# Patient Record
Sex: Female | Born: 1975 | Race: Black or African American | Hispanic: No | Marital: Married | Smoking: Current every day smoker
Health system: Southern US, Community
[De-identification: ages and names within clinical notes are randomized; demographics above are authoritative.]

---

## 2004-11-08 ENCOUNTER — Emergency Department: Payer: Self-pay | Admitting: Emergency Medicine

## 2005-07-18 ENCOUNTER — Emergency Department: Payer: Self-pay | Admitting: Emergency Medicine

## 2007-10-06 ENCOUNTER — Emergency Department: Payer: Self-pay | Admitting: Emergency Medicine

## 2007-12-04 ENCOUNTER — Ambulatory Visit: Payer: Self-pay | Admitting: Family Medicine

## 2007-12-30 ENCOUNTER — Ambulatory Visit: Payer: Self-pay | Admitting: Obstetrics and Gynecology

## 2008-01-02 ENCOUNTER — Ambulatory Visit: Payer: Self-pay | Admitting: Obstetrics and Gynecology

## 2008-01-09 ENCOUNTER — Ambulatory Visit: Payer: Self-pay | Admitting: Obstetrics and Gynecology

## 2009-08-16 IMAGING — US US PELV - US TRANSVAGINAL
1 series · 17 of 25 positions shown · non-contrast
Comparison: none

REASON FOR EXAM: s/p tubal ligation x 3yrs  positive pregnancy test
currently bleeding  CALL ...
COMMENTS:

[Series 1: us pelv - us transvaginal · 17 of 61 slices shown]
[im 1/61]
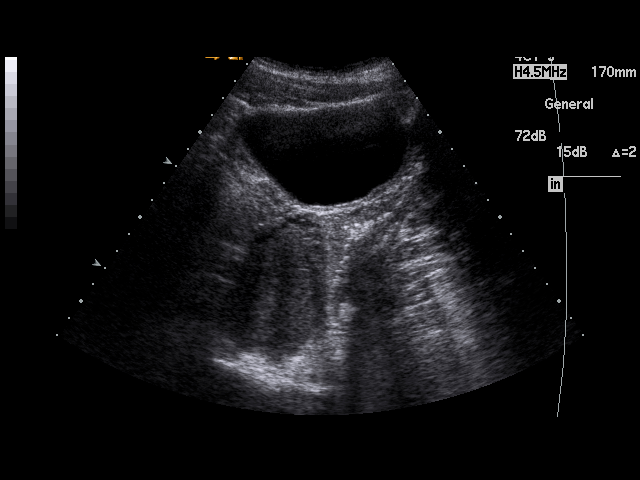
[im 6/61]
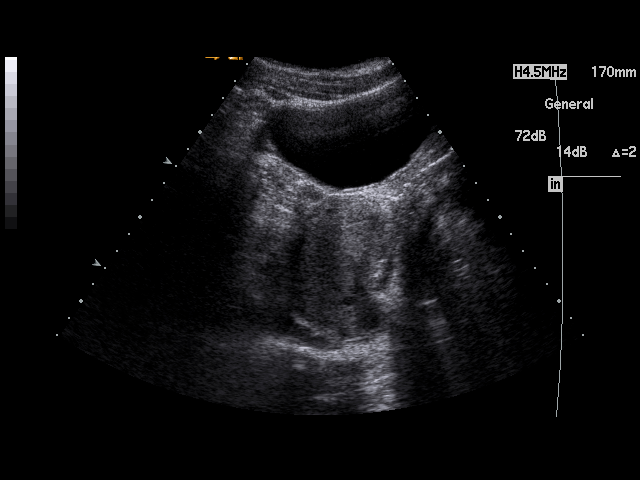
[im 8/61]
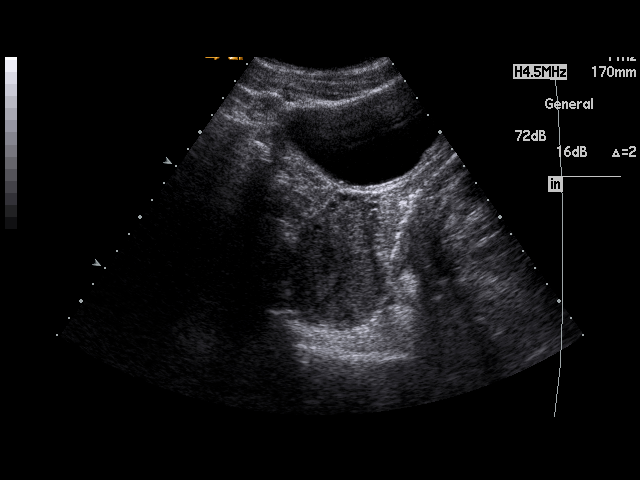
[im 13/61]
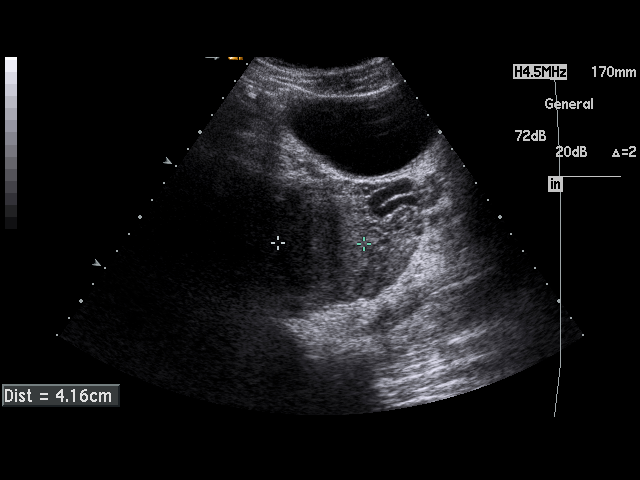
[im 16/61]
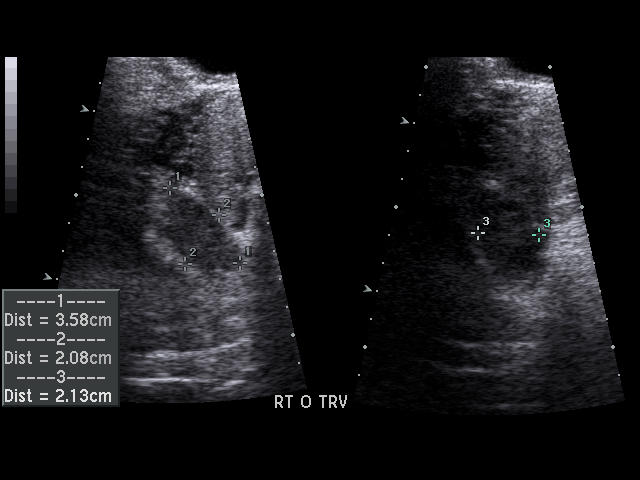
[im 21/61]
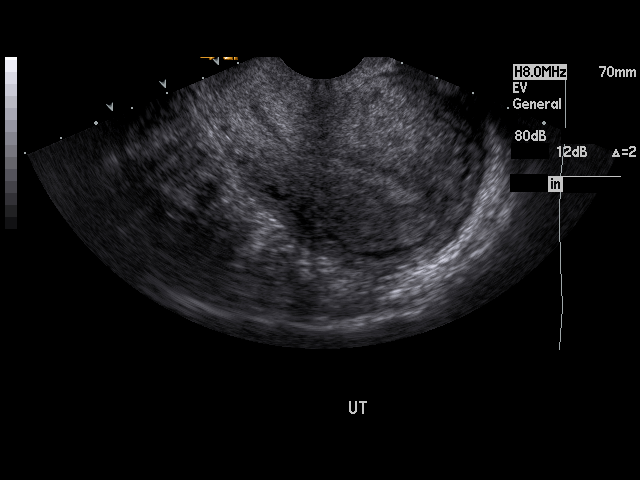
[im 23/61]
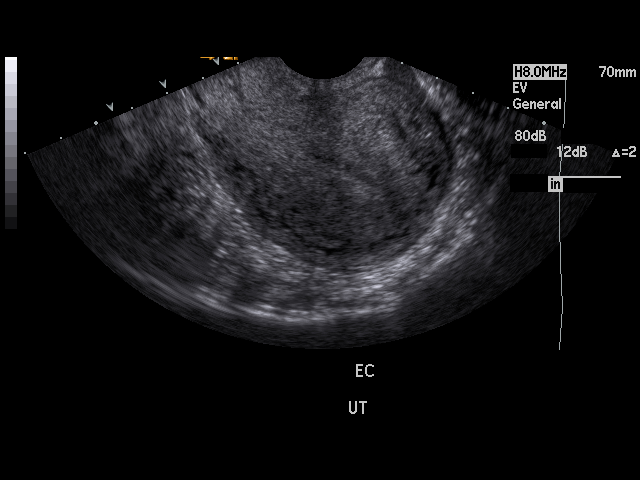
[im 28/61]
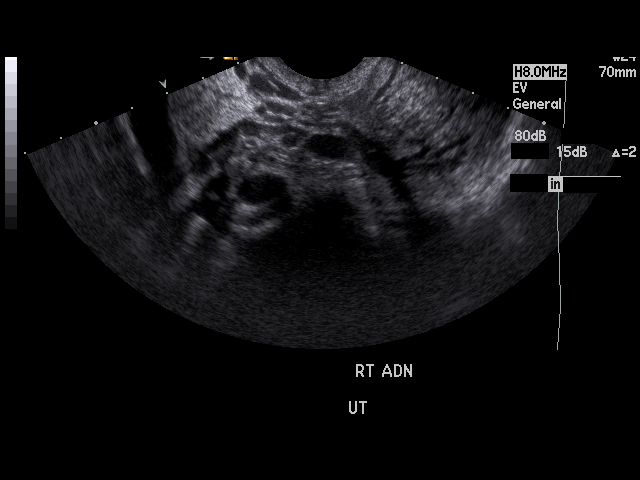
[im 31/61]
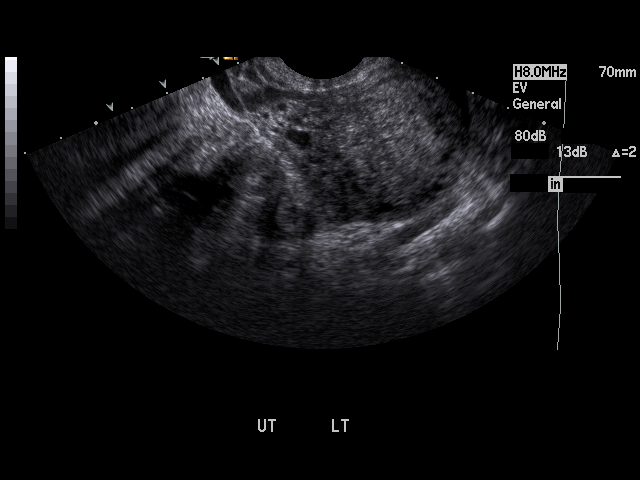
[im 33/61]
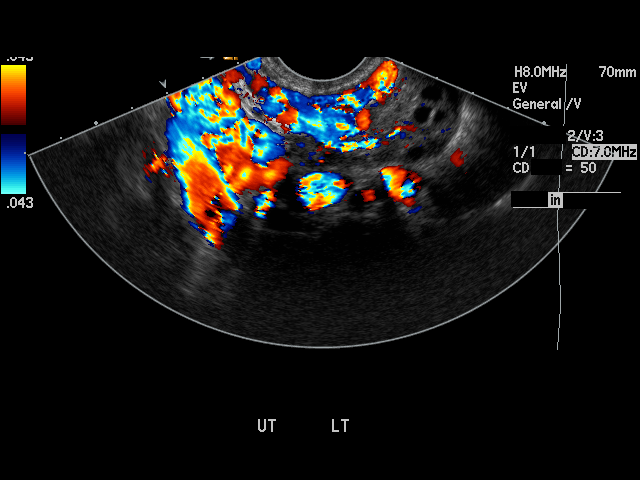
[im 38/61]
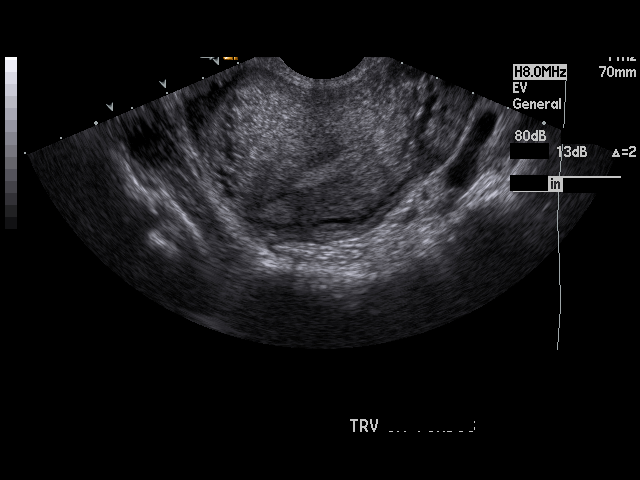
[im 41/61]
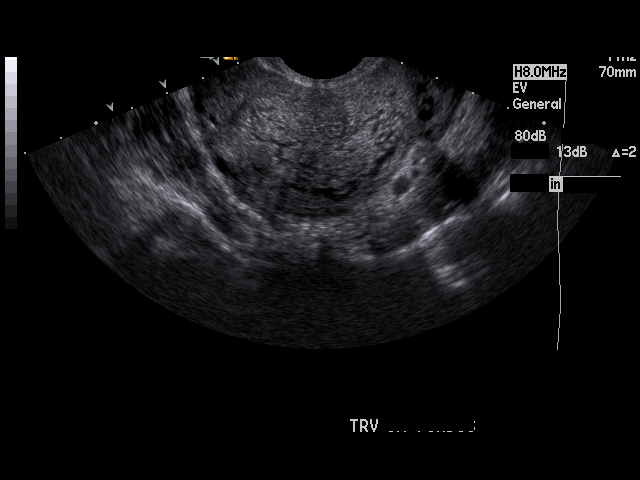
[im 46/61]
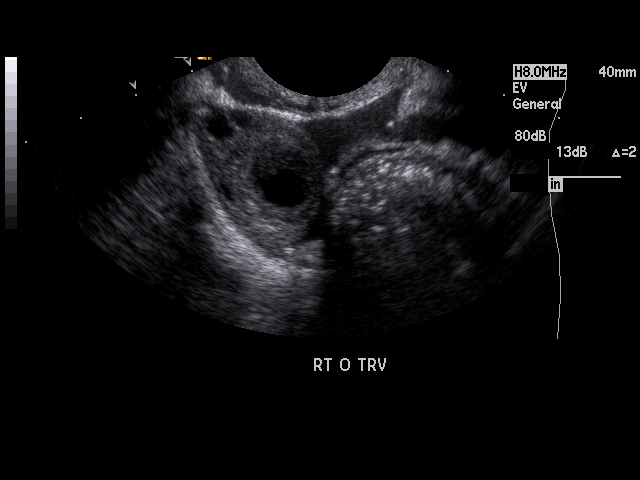
[im 48/61]
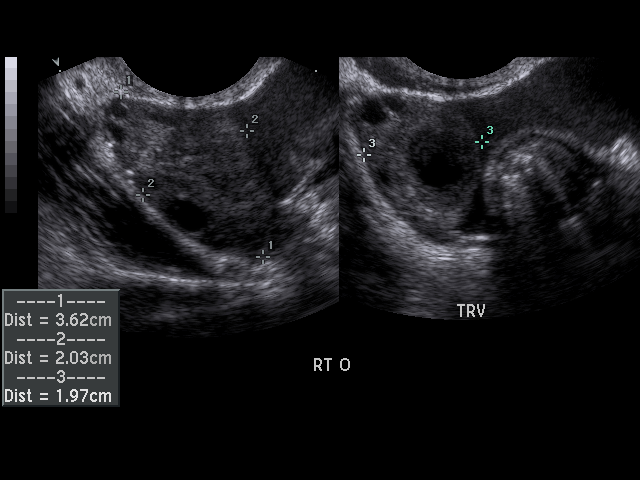
[im 53/61]
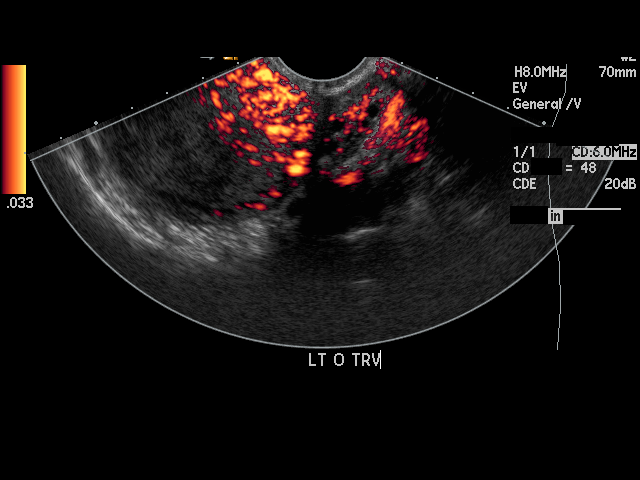
[im 56/61]
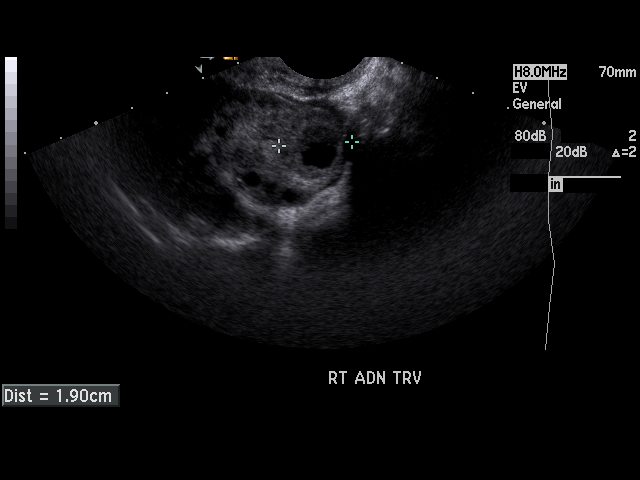
[im 61/61]
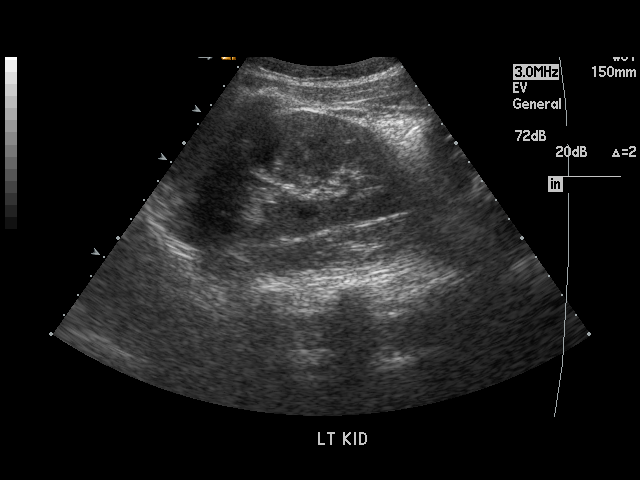

[17 of 25 positions shown; findings below may reference images not displayed]

PROCEDURE:     US  - US PELVIS MASS EXAM  - [DATE] [DATE] [DATE]  [DATE]

RESULT:     Transabdominal and endovaginal ultrasound was performed. The
uterus is retroverted. The uterus measures 8.97 cm x 4.08 cm x 4.6 cm.  No
uterine mass lesions are seen.  The endometrium measures 6.3 mm in
thickness. The RIGHT and LEFT ovaries are visualized. The RIGHT ovary
measures 3.62 cm at maximum diameter and the LEFT ovary measures 3.29 cm at
maximum diameter.  Follicular cysts are seen in each ovary. Additionally, in
the RIGHT ovary there is a hypoechoic area that measures approximately
cm and appears to represent a thick wall cyst. It is possible that this is a
cystic structure adjacent to the ovary rather than within the ovary. No echo
suspicious for fetal parts is seen within the structure. Nonetheless, in a
pregnant patient with no intrauterine gestation seen, the unlikely
possibility of this representing an ectopic pregnancy cannot be totally
excluded and therefore follow-up examination and or serial HCG determination
is recommended. There is a nonspecific trace of free fluid in the pelvis.
IMPRESSION: 1. No intrauterine gestation is observed. In the pregnant patient, the
differential includes very early intrauterine gestation that is not yet
visible sonographically, recent abortion, or ectopic pregnancy.
2. There is nonspecific 1.2 cm cystic area in or adjacent to the RIGHT ovary
for which follow-up examination is recommended.
3. There is a nonspecific trace of free fluid in the pelvis.

## 2009-09-11 IMAGING — US US BREAST BILAT
1 series · 17 of 25 positions shown · non-contrast
Comparison: none

REASON FOR EXAM: right breast area at 12 oclock  left 3x2 cm at 4 oclock
new found problem
COMMENTS:

[Series 1: us breast bilat · 17 of 30 slices shown]
[im 1/30]
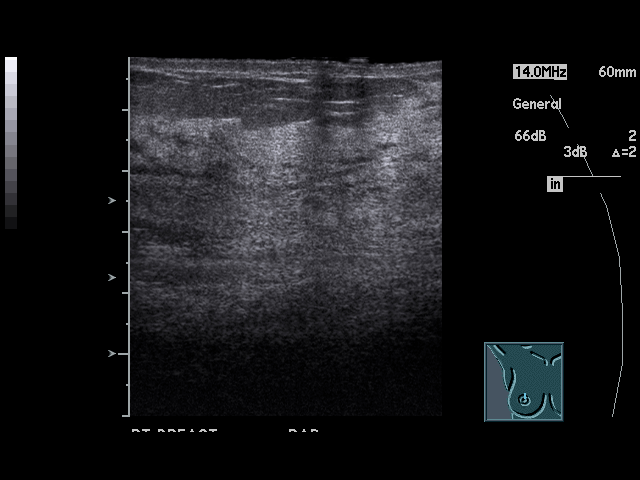
[im 3/30]
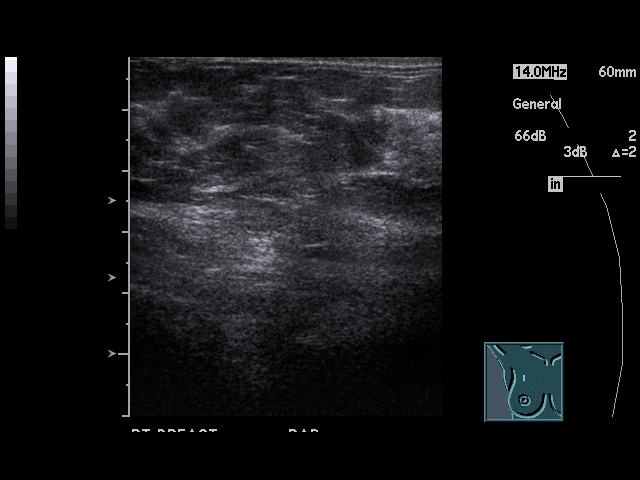
[im 4/30]
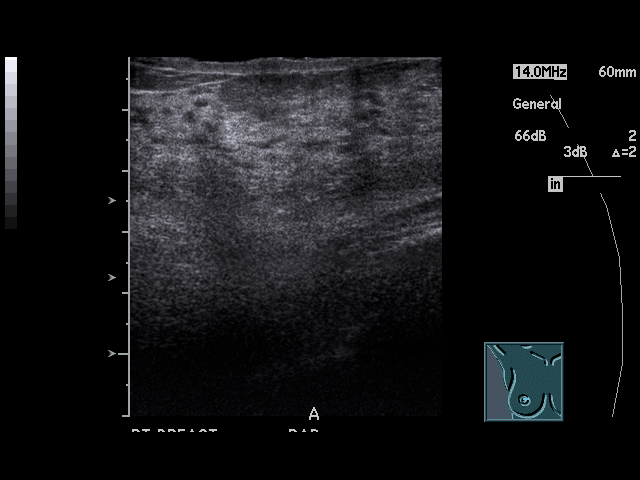
[im 7/30]
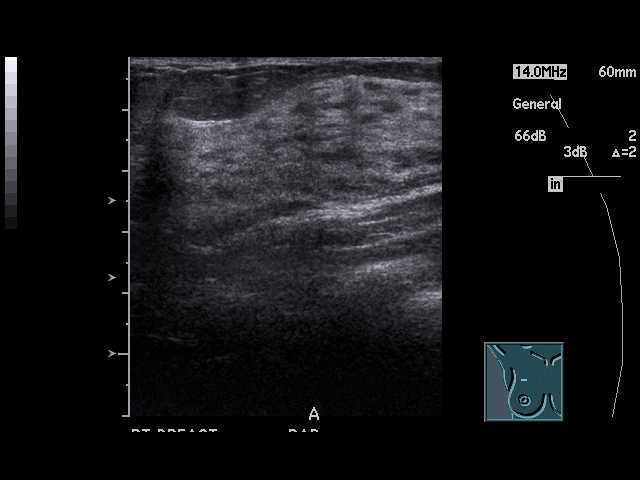
[im 8/30]
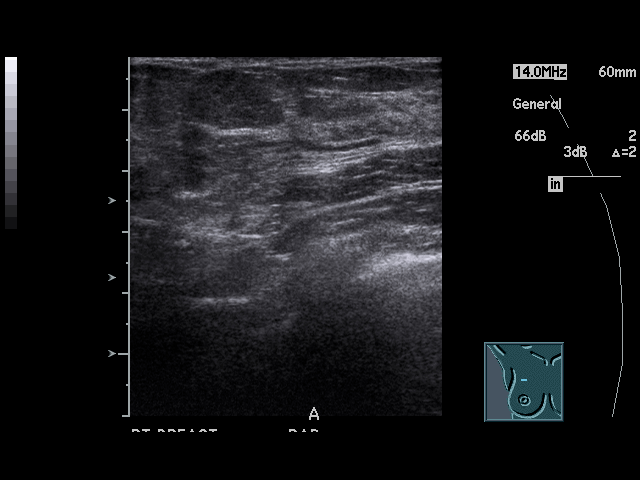
[im 10/30]
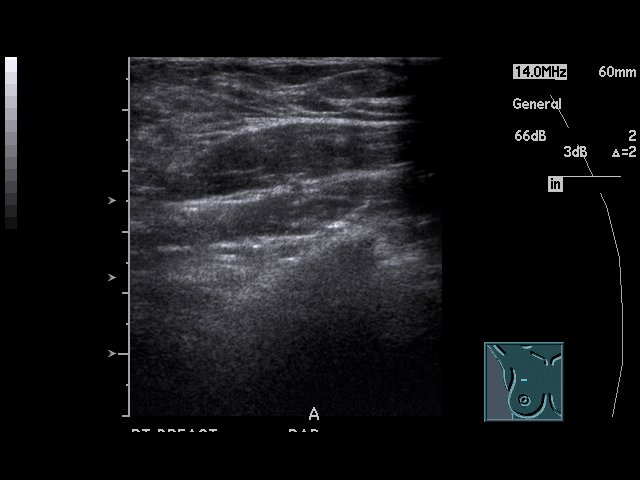
[im 11/30]
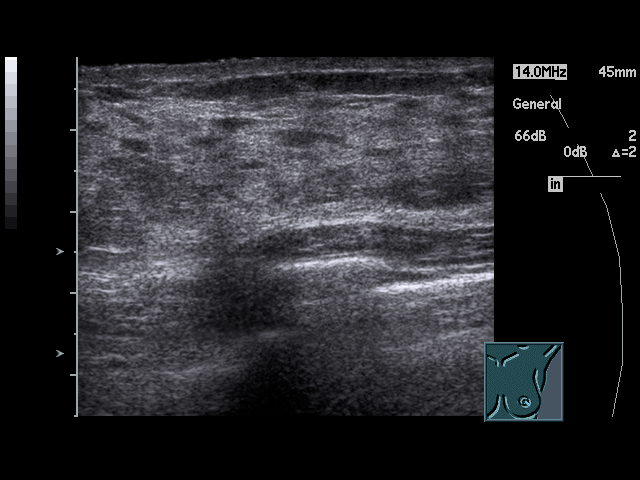
[im 14/30]
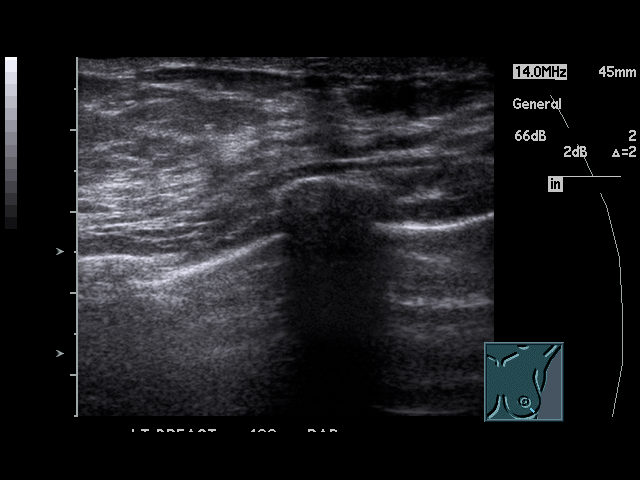
[im 15/30]
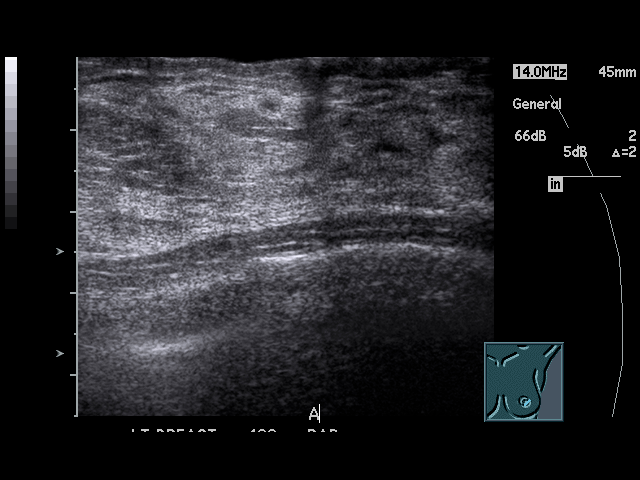
[im 16/30]
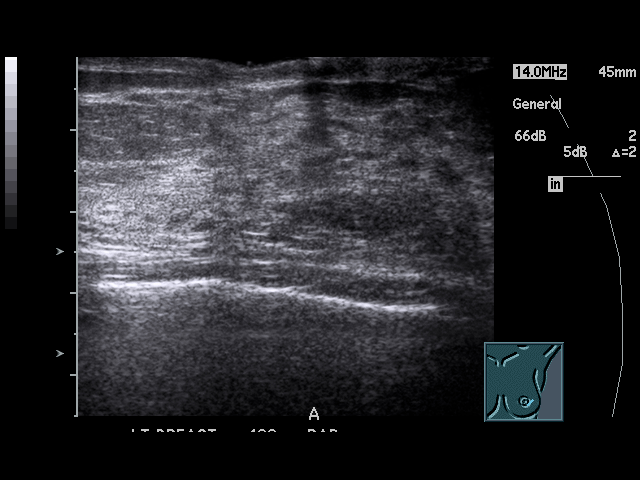
[im 19/30]
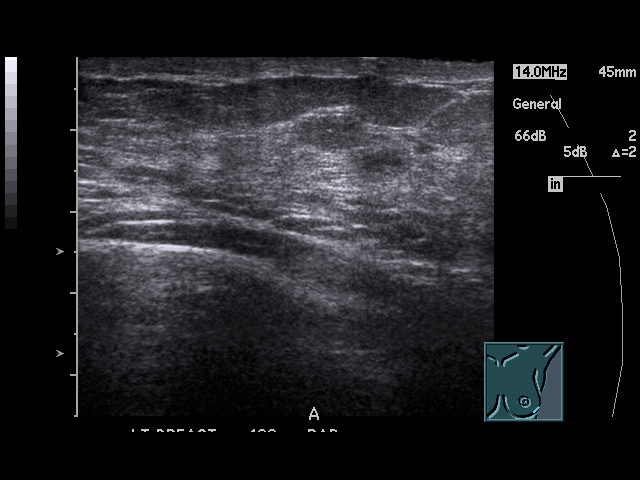
[im 20/30]
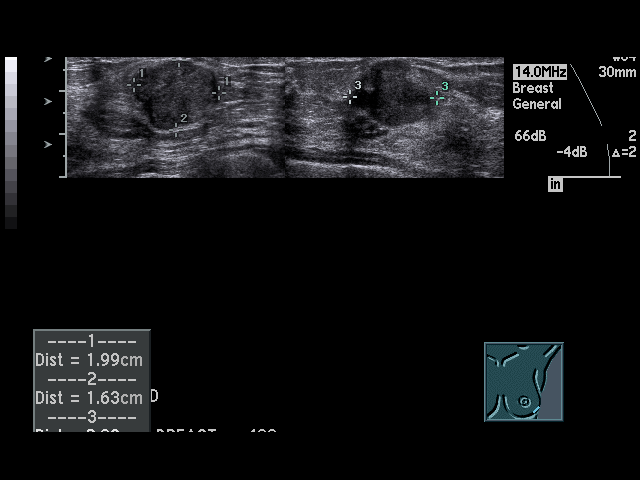
[im 22/30]
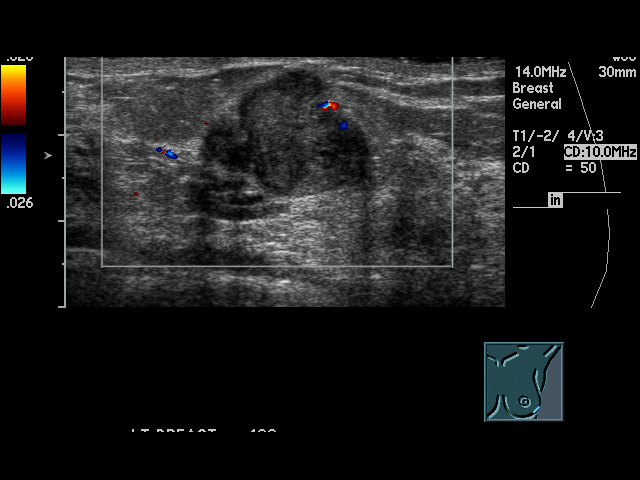
[im 23/30]
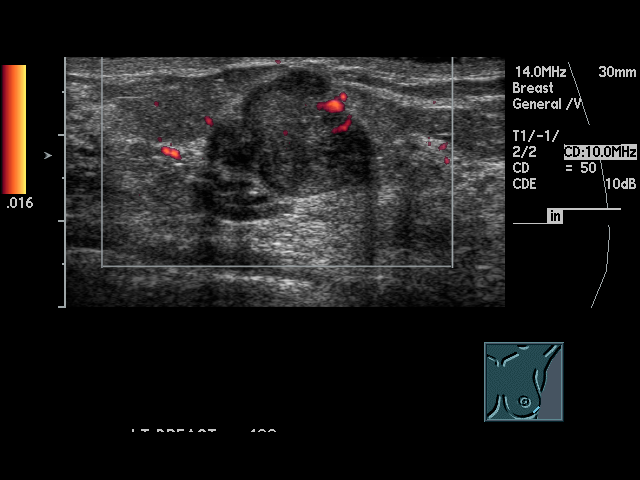
[im 26/30]
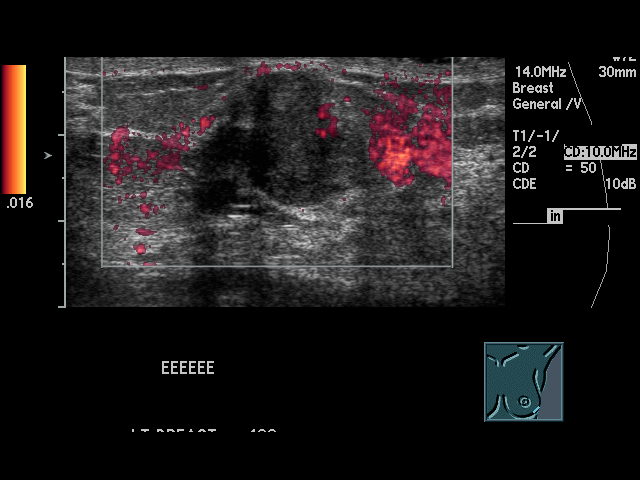
[im 27/30]
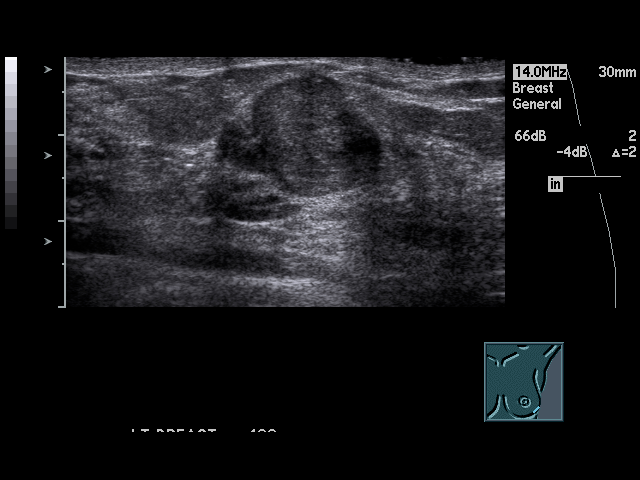
[im 30/30]
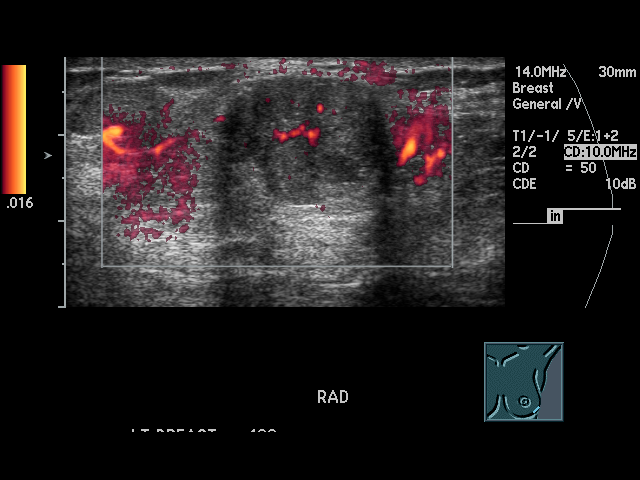

[17 of 25 positions shown; findings below may reference images not displayed]

PROCEDURE:     US  - US BREAST BILATERAL  - December 30, 2007  [DATE]

RESULT:     The patient has a probable area of concern at possibly [DATE].
Sonographic examination targeted to this region shows a slightly lobulated,
hypoechoic mass measuring 2.3 cm in maximum diameter. The findings at this
age group are most consistent with a fibroadenoma. Surgical consultation and
for confirmation by fine needle aspiration or percutaneous biopsy is
recommended.
IMPRESSION: There is a hypoechoic mass at [DATE], most likely representing a fibroadenoma.
Surgical consultation for consideration of percutaneous biopsy is
recommended.

## 2015-08-17 ENCOUNTER — Encounter: Payer: Self-pay | Admitting: *Deleted

## 2015-08-17 ENCOUNTER — Emergency Department
Admission: EM | Admit: 2015-08-17 | Discharge: 2015-08-17 | Disposition: A | Discharge status: Home / Self Care | Payer: Self-pay | Attending: Emergency Medicine | Admitting: Emergency Medicine

## 2015-08-17 DIAGNOSIS — X58XXXA Exposure to other specified factors, initial encounter: Secondary | ICD-10-CM | POA: Insufficient documentation

## 2015-08-17 DIAGNOSIS — Y9389 Activity, other specified: Secondary | ICD-10-CM | POA: Insufficient documentation

## 2015-08-17 DIAGNOSIS — Y92007 Garden or yard of unspecified non-institutional (private) residence as the place of occurrence of the external cause: Secondary | ICD-10-CM | POA: Insufficient documentation

## 2015-08-17 DIAGNOSIS — Y998 Other external cause status: Secondary | ICD-10-CM | POA: Insufficient documentation

## 2015-08-17 DIAGNOSIS — S4991XA Unspecified injury of right shoulder and upper arm, initial encounter: Secondary | ICD-10-CM | POA: Insufficient documentation

## 2015-08-17 DIAGNOSIS — Z720 Tobacco use: Secondary | ICD-10-CM | POA: Insufficient documentation

## 2015-08-17 DIAGNOSIS — M5410 Radiculopathy, site unspecified: Secondary | ICD-10-CM | POA: Insufficient documentation

## 2015-08-17 DIAGNOSIS — S161XXA Strain of muscle, fascia and tendon at neck level, initial encounter: Secondary | ICD-10-CM | POA: Insufficient documentation

## 2015-08-17 MED ORDER — CYCLOBENZAPRINE HCL 5 MG PO TABS: 5.0000 mg | ORAL_TABLET | Freq: Three times a day (TID) | ORAL | Status: AC | PRN

## 2015-08-17 MED ORDER — KETOROLAC TROMETHAMINE 60 MG/2ML IM SOLN
60.0000 mg | Freq: Once | INTRAMUSCULAR | Status: AC
  Administered 2015-08-17: 60 mg via INTRAMUSCULAR
  Filled 2015-08-17: qty 2

## 2015-08-17 MED ORDER — KETOROLAC TROMETHAMINE 10 MG PO TABS: 10.0000 mg | ORAL_TABLET | Freq: Three times a day (TID) | ORAL | Status: AC

## 2015-08-17 MED ORDER — ORPHENADRINE CITRATE 30 MG/ML IJ SOLN
60.0000 mg | INTRAMUSCULAR | Status: AC
  Administered 2015-08-17: 60 mg via INTRAMUSCULAR
  Filled 2015-08-17: qty 2

## 2015-08-17 NOTE — ED Notes (Signed)
Pt states that she has had sharp pain in the middle neck that radiates down her right arm since July, pain worse over the past two weeks. Pt reports repetat ive lifting heavy bags approx 50lbs a piece at work. Pt was seen by work nurse who prescribed skelaxin and PT, pt reports pain not any better. Has scheduled appt with spine clinic October 17.

## 2015-08-17 NOTE — ED Notes (Signed)
AAOx3.  Skin warm and dry.  NAD.  D/C home.  Ambulates with an easy and steady gait.  MAE equally and strong.

## 2015-08-17 NOTE — Discharge Instructions (Signed)
Cervical Sprain A cervical sprain is when the tissues (ligaments) that hold the neck bones in place stretch or tear. HOME CARE   Put ice on the injured area.  Put ice in a plastic bag.  Place a towel between your skin and the bag.  Leave the ice on for 15-20 minutes, 3-4 times a day.  You may have been given a collar to wear. This collar keeps your neck from moving while you heal.  Do not take the collar off unless told by your doctor.  If you have long hair, keep it outside of the collar.  Ask your doctor before changing the position of your collar. You may need to change its position over time to make it more comfortable.  If you are allowed to take off the collar for cleaning or bathing, follow your doctor's instructions on how to do it safely.  Keep your collar clean by wiping it with mild soap and water. Dry it completely. If the collar has removable pads, remove them every 1-2 days to hand wash them with soap and water. Allow them to air dry. They should be dry before you wear them in the collar.  Do not drive while wearing the collar.  Only take medicine as told by your doctor.  Keep all doctor visits as told.  Keep all physical therapy visits as told.  Adjust your work station so that you have good posture while you work.  Avoid positions and activities that make your problems worse.  Warm up and stretch before being active. GET HELP IF:  Your pain is not controlled with medicine.  You cannot take less pain medicine over time as planned.  Your activity level does not improve as expected. GET HELP RIGHT AWAY IF:   You are bleeding.  Your stomach is upset.  You have an allergic reaction to your medicine.  You develop new problems that you cannot explain.  You lose feeling (become numb) or you cannot move any part of your body (paralysis).  You have tingling or weakness in any part of your body.  Your symptoms get worse. Symptoms include:  Pain,  soreness, stiffness, puffiness (swelling), or a burning feeling in your neck.  Pain when your neck is touched.  Shoulder or upper back pain.  Limited ability to move your neck.  Headache.  Dizziness.  Your hands or arms feel week, lose feeling, or tingle.  Muscle spasms.  Difficulty swallowing or chewing. MAKE SURE YOU:   Understand these instructions.  Will watch your condition.  Will get help right away if you are not doing well or get worse.   This information is not intended to replace advice given to you by your health care provider. Make sure you discuss any questions you have with your health care provider.   Document Released: 04/16/2008 Document Revised: 07/01/2013 Document Reviewed: 05/06/2013 Elsevier Interactive Patient Education 2016 [employer]  Radicular Pain Radicular pain in either the arm or leg is usually from a bulging or herniated disk in the spine. A piece of the herniated disk may press against the nerves as the nerves exit the spine. This causes pain which is felt at the tips of the nerves down the arm or leg. Other causes of radicular pain may include:  Fractures.  Heart disease.  Cancer.  An abnormal and usually degenerative state of the nervous system or nerves (neuropathy). Diagnosis may require CT or MRI scanning to determine the primary cause.  Nerves that start  at the neck (nerve roots) may cause radicular pain in the outer shoulder and arm. It can spread down to the thumb and fingers. The symptoms vary depending on which nerve root has been affected. In most cases radicular pain improves with conservative treatment. Neck problems may require physical therapy, a neck collar, or cervical traction. Treatment may take many weeks, and surgery may be considered if the symptoms do not improve.  Conservative treatment is also recommended for sciatica. Sciatica causes pain to radiate from the lower back or buttock area down the leg into the foot.  Often there is a history of back problems. Most patients with sciatica are better after 2 to 4 weeks of rest and other supportive care. Short term bed rest can reduce the disk pressure considerably. Sitting, however, is not a good position since this increases the pressure on the disk. You should avoid bending, lifting, and all other activities which make the problem worse. Traction can be used in severe cases. Surgery is usually reserved for patients who do not improve within the first months of treatment. Only take over-the-counter or prescription medicines for pain, discomfort, or fever as directed by your caregiver. Narcotics and muscle relaxants may help by relieving more severe pain and spasm and by providing mild sedation. Cold or massage can give significant relief. Spinal manipulation is not recommended. It can increase the degree of disc protrusion. Epidural steroid injections are often effective treatment for radicular pain. These injections deliver medicine to the spinal nerve in the space between the protective covering of the spinal cord and back bones (vertebrae). Your caregiver can give you more information about steroid injections. These injections are most effective when given within two weeks of the onset of pain.  You should see your caregiver for follow up care as recommended. A program for neck and back injury rehabilitation with stretching and strengthening exercises is an important part of management.  SEEK IMMEDIATE MEDICAL CARE IF:  You develop increased pain, weakness, or numbness in your arm or leg.  You develop difficulty with bladder or bowel control.  You develop abdominal pain.   This information is not intended to replace advice given to you by your health care provider. Make sure you discuss any questions you have with your health care provider.   Document Released: 12/06/2004 Document Revised: 11/19/2014 Document Reviewed: 05/25/2015 Elsevier Interactive Patient  Education [employer]   Take the prescription meds as directed.  Apply ice to reduce symptoms.  Follow-up with the spinal specialist as scheduled.

## 2015-08-17 NOTE — ED Provider Notes (Signed)
[facility] Emergency Department Provider Note ____________________________________________  Time seen: 2125  I have reviewed the triage vital signs and the nursing notes.  HISTORY  Chief Complaint  Arm Pain  HPI Julie Walls is a 39 y.o. female reports to the ED for evaluation of neck pain with right extremity referral since July. She describes onset after moving mulch bags around her yard at home. She has sought care with her company's nurse practitioner, who over the last 2 weeks has prescribed physical therapy, and she is scheduled for referral with [facility] spine specialist for October 17. Since the onset of the injury she has been prescribed Skelaxin, ibuprofen, and Biofreeze. She denies any significant benefit those medications. She reports today for increased pain without interim injury. She reportsa stiff neck with range of motion to the right. As well as some achiness in the upper extremity. She was last evaluated by the nurse practitioner 2 weeks ago. And since that time has been held out of work pending her referral to the spine specialist. She rates her pain at this 10/10 in triage.  History reviewed. No pertinent past medical history.  There are no active problems to display for this patient.  History reviewed. No pertinent past surgical history.  Current Outpatient Rx  Name  Route  Sig  Dispense  Refill  . cyclobenzaprine (FLEXERIL) 5 MG tablet   Oral   Take 1 tablet (5 mg total) by mouth every 8 (eight) hours as needed for muscle spasms.   12 tablet   0   . ketorolac (TORADOL) 10 MG tablet   Oral   Take 1 tablet (10 mg total) by mouth every 8 (eight) hours.   15 tablet   0    Allergies Review of patient's allergies indicates no known allergies.  No family history on file.  Social History Social History  Substance Use Topics  . Smoking status: Current Every Day Smoker  . Smokeless tobacco: None  . Alcohol Use: Yes   Review of  Systems  Constitutional: Negative for fever. Eyes: Negative for visual changes. ENT: Negative for sore throat. Cardiovascular: Negative for chest pain. Respiratory: Negative for shortness of breath. Gastrointestinal: Negative for abdominal pain, vomiting and diarrhea. Genitourinary: Negative for dysuria. Musculoskeletal: Negative for back pain. Reports neck and right upper extremity pain as above. Skin: Negative for rash. Neurological: Negative for headaches, focal weakness or numbness. ____________________________________________  PHYSICAL EXAM:  VITAL SIGNS: ED Triage Vitals  Enc Vitals Group     BP 08/17/15 2019 165/92 mmHg     Pulse Rate 08/17/15 2019 101     Resp 08/17/15 2019 18     Temp 08/17/15 2019 98.3 F (36.8 C)     Temp Source 08/17/15 2019 Oral     SpO2 08/17/15 2019 100 %     Weight 08/17/15 2019 202 lb (91.627 kg)     Height 08/17/15 2019  (1.651 m)     Head Cir --      Peak Flow --      Pain Score 08/17/15 2019 10     Pain Loc --      Pain Edu? --      Excl. in GC? --    Constitutional: Alert and oriented. Well appearing and in no distress. Eyes: Conjunctivae are normal. PERRL. Normal extraocular movements. ENT   Head: Normocephalic and atraumatic.   Nose: No congestion/rhinorrhea.   Mouth/Throat: Mucous membranes are moist.   Neck: Supple. No thyromegaly. Decreased  right rotation and right lateral bending on exam. Hematological/Lymphatic/Immunological: No cervical lymphadenopathy. Cardiovascular: Normal rate, regular rhythm.  Respiratory: Normal respiratory effort. No wheezes/rales/rhonchi. Gastrointestinal: Soft and nontender. No distention. Musculoskeletal: No spinal deformity, spasm, or midline tenderness. Right upper extremity with normal range of motion in all planes. No rotator cuff deficits appreciated. Nontender with normal range of motion in all other extremities.  Neurologic: Cranial nerves II through XII grossly intact.  Normal intrinsic and opposition testing. Normal gait without ataxia. Normal speech and language. No gross focal neurologic deficits are appreciated. Skin:  Skin is warm, dry and intact. No rash noted. Psychiatric: Mood and affect are normal. Patient exhibits appropriate insight and judgment. ____________________________________________   RADIOLOGY deferred ____________________________________________  PROCEDURES  Toradol 60 mg IM Norflex 60 mg IM ____________________________________________  INITIAL IMPRESSION / ASSESSMENT AND PLAN / ED COURSE  Patient with cervical strain, and right upper wrist extremity radicular symptoms. No indication of neuromuscular deficit on exam. Patient reports pain at a 0/10 upon discharge. She will be discharged with a prescription for cyclobenzaprine and ketorolac to dose as directed. Imaging is deferred to [facility] spinal care specialist as planned on 08/29/15. Patient is to follow-up with her Julie Walls, Julie Walls. Nurse practitioner for follow-up care. ____________________________________________  FINAL CLINICAL IMPRESSION(S) / ED DIAGNOSES  Final diagnoses:  Neck strain, initial encounter  Radiculopathy affecting upper extremity      Julie Hoard, PA-C 08/17/15 2252  Julie Ramus, MD 08/17/15 2351

## 2015-08-17 NOTE — ED Notes (Signed)
AAOx3.  Skin warm and dry. NAD.  Ambulates with easy and steady gait.   

## 2015-10-04 ENCOUNTER — Encounter: Payer: Self-pay | Admitting: Emergency Medicine

## 2015-10-04 ENCOUNTER — Emergency Department
Admission: EM | Admit: 2015-10-04 | Discharge: 2015-10-04 | Disposition: A | Discharge status: Home / Self Care | Payer: Self-pay | Attending: Emergency Medicine | Admitting: Emergency Medicine

## 2015-10-04 DIAGNOSIS — Z79899 Other long term (current) drug therapy: Secondary | ICD-10-CM | POA: Insufficient documentation

## 2015-10-04 DIAGNOSIS — K047 Periapical abscess without sinus: Secondary | ICD-10-CM

## 2015-10-04 DIAGNOSIS — F17200 Nicotine dependence, unspecified, uncomplicated: Secondary | ICD-10-CM | POA: Insufficient documentation

## 2015-10-04 DIAGNOSIS — Z791 Long term (current) use of non-steroidal anti-inflammatories (NSAID): Secondary | ICD-10-CM | POA: Insufficient documentation

## 2015-10-04 DIAGNOSIS — K029 Dental caries, unspecified: Secondary | ICD-10-CM | POA: Insufficient documentation

## 2015-10-04 MED ORDER — IBUPROFEN 800 MG PO TABS: 800.0000 mg | ORAL_TABLET | Freq: Three times a day (TID) | ORAL | Status: AC | PRN

## 2015-10-04 MED ORDER — TRAMADOL HCL 50 MG PO TABS
50.0000 mg | ORAL_TABLET | Freq: Once | ORAL | Status: AC
  Administered 2015-10-04: 50 mg via ORAL
  Filled 2015-10-04: qty 1

## 2015-10-04 MED ORDER — OXYCODONE-ACETAMINOPHEN 7.5-325 MG PO TABS: 1.0000 | ORAL_TABLET | ORAL | Status: AC | PRN

## 2015-10-04 MED ORDER — IBUPROFEN 800 MG PO TABS
800.0000 mg | ORAL_TABLET | Freq: Once | ORAL | Status: AC
  Administered 2015-10-04: 800 mg via ORAL
  Filled 2015-10-04: qty 1

## 2015-10-04 MED ORDER — AMOXICILLIN 500 MG PO CAPS: 500.0000 mg | ORAL_CAPSULE | Freq: Three times a day (TID) | ORAL | Status: AC

## 2015-10-04 NOTE — ED Provider Notes (Signed)
[facility] Emergency Department Provider Note  ____________________________________________  Time seen: Approximately 12:22 PM  I have reviewed the triage vital signs and the nursing notes.   HISTORY  Chief Complaint Facial Swelling    HPI Julie Walls is a 39 y.o. female left facial swelling for 2 days. Patient states the swelling has increased since last night. Patient has history of devitalized left upper molars. She states she has a Education officer, communitydentist but has not made an appointment. Patient rates the pain as a 7/10. No palliative measures taken his complaint.  History reviewed. No pertinent past medical history.  There are no active problems to display for this patient.   History reviewed. No pertinent past surgical history.  Current Outpatient Rx  Name  Route  Sig  Dispense  Refill  . gabapentin (NEURONTIN) 300 MG capsule   Oral   Take 900 mg by mouth 3 (three) times daily.         Marland Kitchen. amoxicillin (AMOXIL) 500 MG capsule   Oral   Take 1 capsule (500 mg total) by mouth 3 (three) times daily.   30 capsule   0   . cyclobenzaprine (FLEXERIL) 5 MG tablet   Oral   Take 1 tablet (5 mg total) by mouth every 8 (eight) hours as needed for muscle spasms.   12 tablet   0   . ibuprofen (ADVIL,MOTRIN) 800 MG tablet   Oral   Take 1 tablet (800 mg total) by mouth every 8 (eight) hours as needed for moderate pain.   15 tablet   0   . ketorolac (TORADOL) 10 MG tablet   Oral   Take 1 tablet (10 mg total) by mouth every 8 (eight) hours.   15 tablet   0   . oxyCODONE-acetaminophen (PERCOCET) 7.5-325 MG tablet   Oral   Take 1 tablet by mouth every 4 (four) hours as needed for severe pain.   20 tablet   0     Allergies Review of patient's allergies indicates no known allergies.  No family history on file.  Social History Social History  Substance Use Topics  . Smoking status: Current Every Day Smoker  . Smokeless tobacco: None  . Alcohol Use: Yes     Review of Systems Constitutional: No fever/chills Eyes: No visual changes. ENT: No sore throat. Cardiovascular: Denies chest pain. Respiratory: Denies shortness of breath. Gastrointestinal: No abdominal pain.  No nausea, no vomiting.  No diarrhea.  No constipation. Genitourinary: Negative for dysuria. Musculoskeletal: Negative for back pain. Skin: Negative for rash. Neurological: Negative for headaches, focal weakness or numbness. 10-point ROS otherwise negative.  ____________________________________________   PHYSICAL EXAM:  VITAL SIGNS: ED Triage Vitals  Enc Vitals Group     BP 10/04/15 1159 133/78 mmHg     Pulse Rate 10/04/15 1159 85     Resp 10/04/15 1159 18     Temp 10/04/15 1159 99.2 F (37.3 C)     Temp Source 10/04/15 1159 Oral     SpO2 10/04/15 1159 97 %     Weight 10/04/15 1159 205 lb (92.987 kg)     Height 10/04/15 1159 5\' 5"  (1.651 m)     Head Cir --      Peak Flow --      Pain Score 10/04/15 1159 7     Pain Loc --      Pain Edu? --      Excl. in GC? --     Constitutional: Alert and oriented.  Well appearing and in no acute distress. Eyes: Conjunctivae are normal. PERRL. EOMI. Head: Atraumatic. Nose: No congestion/rhinnorhea. Mouth/Throat: Mucous membranes are moist.  Oropharynx non-erythematous. Edematous gingiva left upper molar area with multiple caries. Neck: No stridor.  No cervical spine tenderness to palpation. Hematological/Lymphatic/Immunilogical: No cervical lymphadenopathy. Cardiovascular: Normal rate, regular rhythm. Grossly normal heart sounds.  Good peripheral circulation. Respiratory: Normal respiratory effort.  No retractions. Lungs CTAB. Gastrointestinal: Soft and nontender. No distention. No abdominal bruits. No CVA tenderness. Musculoskeletal: No lower extremity tenderness nor edema.  No joint effusions. Neurologic:  Normal speech and language. No gross focal neurologic deficits are appreciated. No gait instability. Skin:  Skin is  warm, dry and intact. No rash noted. Psychiatric: Mood and affect are normal. Speech and behavior are normal.  ____________________________________________   LABS (all labs ordered are listed, but only abnormal results are displayed)  Labs Reviewed - No data to display ____________________________________________  EKG   ____________________________________________  RADIOLOGY   ____________________________________________   PROCEDURES  Procedure(s) performed: None  Critical Care performed: No  ____________________________________________   INITIAL IMPRESSION / ASSESSMENT AND PLAN / ED COURSE  Pertinent labs & imaging results that were available during my care of the patient were reviewed by me and considered in my medical decision making (see chart for details).  Dental abscess left upper molar. Patient given a prescription for amoxicillin Percocets, and ibuprofen. Patient advised to contact her dentist scheduled appointment. ____________________________________________   FINAL CLINICAL IMPRESSION(S) / ED DIAGNOSES  Final diagnoses:  Dental abscess      Joni Reining, PA-C 10/04/15 1231  Emily Filbert, MD 10/04/15 1259

## 2015-10-04 NOTE — ED Notes (Signed)
Pt states she woke up this am with pain and swelling to left side of face

## 2015-10-04 NOTE — Discharge Instructions (Signed)
Advised to contact scheduling with his treating dentist. Dental Abscess A dental abscess is pus in or around a tooth. HOME CARE  Take medicines only as told by your dentist.  If you were prescribed antibiotic medicine, finish all of it even if you start to feel better.  Rinse your mouth (gargle) often with salt water.  Do not drive or use heavy machinery, like a lawn mower, while taking pain medicine.  Do not apply heat to the outside of your mouth.  Keep all follow-up visits as told by your dentist. This is important. GET HELP IF:  Your pain is worse, and medicine does not help. GET HELP RIGHT AWAY IF:  You have a fever or chills.  Your symptoms suddenly get worse.  You have a very bad headache.  You have problems breathing or swallowing.  You have trouble opening your mouth.  You have puffiness (swelling) in your neck or around your eye.   This information is not intended to replace advice given to you by your health care provider. Make sure you discuss any questions you have with your health care provider.   Document Released: 03/15/2015 Document Reviewed: 03/15/2015 Elsevier Interactive Patient Education [employer]

## 2016-07-16 ENCOUNTER — Encounter: Payer: Self-pay | Admitting: Emergency Medicine

## 2016-07-16 DIAGNOSIS — F17200 Nicotine dependence, unspecified, uncomplicated: Secondary | ICD-10-CM | POA: Insufficient documentation

## 2016-07-16 DIAGNOSIS — K0889 Other specified disorders of teeth and supporting structures: Secondary | ICD-10-CM | POA: Insufficient documentation

## 2016-07-16 NOTE — ED Triage Notes (Signed)
Pt presents to ED with c/o left upper dental pain and left facial swelling for few days, worse today. Pt was seen by a dentist and planned to removed the tooth but pt lost health insurance.

## 2016-07-17 ENCOUNTER — Emergency Department
Admission: EM | Admit: 2016-07-17 | Discharge: 2016-07-17 | Disposition: A | Discharge status: Home / Self Care | Payer: Self-pay | Attending: Emergency Medicine | Admitting: Emergency Medicine

## 2016-07-17 DIAGNOSIS — K0889 Other specified disorders of teeth and supporting structures: Secondary | ICD-10-CM

## 2016-07-17 MED ORDER — PENICILLIN V POTASSIUM 500 MG PO TABS: 500.0000 mg | ORAL_TABLET | Freq: Four times a day (QID) | ORAL | 0 refills | Status: AC

## 2016-07-17 MED ORDER — OXYCODONE-ACETAMINOPHEN 5-325 MG PO TABS
1.0000 | ORAL_TABLET | Freq: Once | ORAL | Status: AC
  Administered 2016-07-17: 1 via ORAL
  Filled 2016-07-17: qty 1

## 2016-07-17 MED ORDER — OXYCODONE-ACETAMINOPHEN 5-325 MG PO TABS
1.0000 | ORAL_TABLET | ORAL | Status: AC | PRN
  Administered 2016-07-17: 1 via ORAL
  Filled 2016-07-17: qty 1

## 2016-07-17 MED ORDER — OXYCODONE-ACETAMINOPHEN 5-325 MG PO TABS: 1.0000 | ORAL_TABLET | ORAL | Status: DC | PRN

## 2016-07-17 MED ORDER — PENICILLIN V POTASSIUM 250 MG PO TABS
500.0000 mg | ORAL_TABLET | Freq: Once | ORAL | Status: AC
  Administered 2016-07-17: 500 mg via ORAL
  Filled 2016-07-17: qty 2

## 2016-07-17 MED ORDER — TRAMADOL HCL 50 MG PO TABS: 50.0000 mg | ORAL_TABLET | Freq: Four times a day (QID) | ORAL | 0 refills | Status: AC | PRN

## 2016-07-17 NOTE — ED Provider Notes (Signed)
[facility] Emergency Department Provider Note  Time seen: 1:09 AM  I have reviewed the triage vital signs and the nursing notes.   HISTORY  Chief Complaint Dental Pain    HPI Julie Walls is a 41 y.o. female with no past medical history who presents to the emergency department for left-sided dental pain. According to the patient for the past several weeks she has been experiencing pain in her left upper molar. Patient has a fractured/decayed tooth in this area. States she has been told the tooth needs to be removed but she did not have insurance until recently. Patient states she has recently obtained own insurance and is currently waiting to see a dentist. However she states over the past 2-3 days the pain is worsened and she feels like it is swelling. States she has been having difficulty sleeping due to the pain.  History reviewed. No pertinent past medical history.  There are no active problems to display for this patient.   History reviewed. No pertinent surgical history.  Prior to Admission medications   Medication Sig Start Date End Date Taking? Authorizing Provider  amoxicillin (AMOXIL) 500 MG capsule Take 1 capsule (500 mg total) by mouth 3 (three) times daily. 10/04/15   Joni Reining, PA-C  cyclobenzaprine (FLEXERIL) 5 MG tablet Take 1 tablet (5 mg total) by mouth every 8 (eight) hours as needed for muscle spasms. 08/17/15   Jenise V Bacon Menshew, PA-C  gabapentin (NEURONTIN) 300 MG capsule Take 900 mg by mouth 3 (three) times daily.    Historical Provider, MD  ibuprofen (ADVIL,MOTRIN) 800 MG tablet Take 1 tablet (800 mg total) by mouth every 8 (eight) hours as needed for moderate pain. 10/04/15   Joni Reining, PA-C  ketorolac (TORADOL) 10 MG tablet Take 1 tablet (10 mg total) by mouth every 8 (eight) hours. 08/17/15   Jenise V Bacon Menshew, PA-C  oxyCODONE-acetaminophen (PERCOCET) 7.5-325 MG tablet Take 1 tablet by mouth every 4 (four) hours as  needed for severe pain. 10/04/15   Joni Reining, PA-C    No Known Allergies  History reviewed. No pertinent family history.  Social History Social History  Substance Use Topics  . Smoking status: Current Every Day Smoker  . Smokeless tobacco: Never Used  . Alcohol use Yes    Review of Systems Constitutional: Negative for fever. Cardiovascular: Negative for chest pain. Respiratory: Negative for shortness of breath. Gastrointestinal: Negative for abdominal pain, vomiting Skin: Negative for rash. Neurological: Negative for headache 10-point ROS otherwise negative.  ____________________________________________   PHYSICAL EXAM:  VITAL SIGNS: ED Triage Vitals   Enc Vitals Group     BP (!) 141/88     Pulse Rate 84     Resp 18     Temp 98.3 F (36.8 C)     Temp Source Oral     SpO2 97 %     Weight 190 lb (86.2 kg)     Height 5\' 5"  (1.651 m)     Head Circumference      Peak Flow      Pain Score 10     Pain Loc      Pain Edu?      Excl. in GC?     Constitutional: Alert and oriented. Well appearing and in no distress. Eyes: Normal exam ENT   Head: Normocephalic and atraumatic   Mouth/Throat: Mucous membranes are moist.Poor dentition overall, fractured tooth in the left upper molar, as well as left  normal. No signs of abscess, nontender gums. No signs of edema or swelling. Cardiovascular: Normal rate, regular rhythm. No murmur Respiratory: Normal respiratory effort without tachypnea nor retractions. Breath sounds are clear  Gastrointestinal: Soft and nontender. No distention. Musculoskeletal: Nontender with normal range of motion in all extremities. Neurologic:  Normal speech and language. No gross focal neurologic deficits  Psychiatric: Mood and affect are normal.  ____________________________________________   INITIAL IMPRESSION / ASSESSMENT AND PLAN / ED COURSE  Pertinent labs & imaging results that were available during my care of the patient were  reviewed by me and considered in my medical decision making (see chart for details).  Patient presents the emergency department for dental pain. Exam consistent with fracture 2/decayed tooth which appears chronic, no signs of abscess. I discussed with the patient the need to follow up with a dentist as soon as possible to have the tooth removed. The patient is agreeable to plan. We'll cover penicillin and place a short course of Ultram.  ____________________________________________   FINAL CLINICAL IMPRESSION(S) / ED DIAGNOSES  Dental pain    Minna AntisKevin Kolt Mcwhirter, MD 07/17/16 [phone]

## 2016-07-17 NOTE — Discharge Instructions (Signed)
OPTIONS FOR DENTAL FOLLOW UP CARE ° °[place] Department of Health and Human Services - Local Safety Net Dental Clinics °http://www.ncdhhs.gov/dph/oralhealth/services/safetynetclinics.htm °  °[facility] (336-562-3123) ° °[facility] (919-933-9087) ° °[facility] ([phone] ext [phone]) ° °[facility] ([phone]) ° °[facility] ([phone]) °This clinic caters to the indigent population and is on a lottery system. °Location: °[facility], [address], [place] °Clinic Hours: °Wednesdays from 6pm - 9pm, patients seen by a lottery system. °For dates, call or go to [email or web address]/[facility]/[email or web address] °Services: °Cleanings, fillings and simple extractions. °Payment Options: °DENTAL WORK IS FREE OF CHARGE. Bring proof of income or support. °Best way to get seen: °Arrive at 5:15 pm - this is a lottery, NOT first come/first serve, so arriving earlier will not increase your chances of being seen. °  °  °[facility] °[phone] °Select option 1 for emergencies °  °Location: °[facility], [address], [place] °Clinic Hours: °No walk-ins accepted - call the day before to schedule an appointment. °Check in times are 9:30 am and 1:30 pm. °Services: °Simple extractions, temporary fillings, pulpectomy/pulp debridement, uncomplicated abscess drainage. °Payment Options: °PAYMENT IS DUE AT THE TIME OF SERVICE.  Fee is usually $100-200, additional surgical procedures (e.g. abscess drainage) may be extra. °Cash, checks, Visa/MasterCard accepted.  Can file Medicaid if patient is covered for dental - patient should call case worker to check. °No discount for [facility] patients. °Best way to get seen: °MUST call the day before and get onto the schedule. Can usually be seen the next 1-2 days. No walk-ins accepted. °  °  °[facility] °[phone] °   °Location: °[facility], [address], [place] °Clinic Hours: °M, W, Th, F 8am or 1:30pm, Tues 9a or 1:30 - first come/first served. °Services: °Simple extractions, temporary fillings, uncomplicated abscess drainage.  You do not need to be an [place] resident. °Payment Options: °PAYMENT IS DUE AT THE TIME OF SERVICE. °Dental insurance, otherwise sliding scale - bring proof of income or support. °Depending on income and treatment needed, cost is usually $50-200. °Best way to get seen: °Arrive early as it is first come/first served. °  °  °[facility] °[phone] °  °Location: °[address] °Clinic Hours: °Mon-Thu 8a-5p °Services: °Most basic dental services including extractions and fillings. °Payment Options: °PAYMENT IS DUE AT THE TIME OF SERVICE. °Sliding scale, up to 50% off - bring proof if income or support. °Medicaid with dental option accepted. °Best way to get seen: °Call to schedule an appointment, can usually be seen within 2 weeks OR they will try to see walk-ins - show up at 8a or 2p (you may have to wait). °  °  °[facility] °[phone] °[place] RESIDENTS ONLY °  °Location: °[facility], [address], [place], [place] [address] °Clinic Hours: By appointment only. °Monday - Thursday 8am-5pm, Friday 8am-12pm °Services: Cleanings, fillings, extractions. °Payment Options: °PAYMENT IS DUE AT THE TIME OF SERVICE. °Cash, Visa or MasterCard. Sliding scale - $30 minimum per service. °Best way to get seen: °Come in to office, complete packet and make an appointment - need proof of income °or support monies for each household member and proof of [place] residence. °Usually takes about a month to get in. °  °  °[facility] °[phone] °  °Location: °[address]   [place] °Clinic Hours: Walk-in Urgent Care Dental Services are offered Monday-Friday  mornings only. °The numbers of emergencies accepted daily is limited to the number of °providers available. °Maximum 15 - Mondays, Wednesdays & Thursdays °Maximum 10 - Tuesdays & Fridays °Services: °You do not need to be a [place] resident to be seen for a dental emergency. °Emergencies are defined as pain, swelling, abnormal bleeding, or dental trauma. Walkins will receive x-rays if needed. °NOTE: Dental cleaning is not an emergency. °Payment Options: °PAYMENT IS DUE AT THE TIME OF SERVICE. °Minimum co-pay is $40.00 for uninsured patients. °Minimum co-pay is $3.00 for Medicaid with dental coverage. °Dental Insurance is accepted and must be presented at time of visit. °Medicare does not cover dental. °Forms of payment: Cash, credit card, checks. °Best way to get seen: °If not previously registered with the clinic, walk-in dental registration begins at 7:15 am and is on a first come/first serve basis. °If previously registered with the clinic, call to make an appointment. °  °  °[facility] °[phone] °[place] RESIDENTS ONLY °  °Location: °[address], [place], [place] °Clinic Hours: °Mon-Thu 10a-2p °Services: Extractions only! °Payment Options: °FREE (donations accepted) - bring proof of income or support °Best way to get seen: °Call and schedule an appointment OR come at 8am on the 1st Monday of every month (except for holidays) when it is first come/first served. °  °  °[facility] °[phone] °  °Location: °[address], [place] °Clinic Hours: °Friday mornings °Services, Payment Options, Best way to get seen: °Call for info °
